# Patient Record
Sex: Male | Born: 2005
Health system: Southern US, Community
[De-identification: ages and names within clinical notes are randomized; demographics above are authoritative.]

---
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
## 2024-06-10 ENCOUNTER — Other Ambulatory Visit: Payer: Self-pay
